# Patient Record
Sex: Female | Born: 1995 | Race: Black or African American | Hispanic: No | Marital: Single | Smoking: Never smoker
Health system: Southern US, Community
[De-identification: ages and names within clinical notes are randomized; demographics above are authoritative.]

## PROBLEM LIST (undated history)

## (undated) DIAGNOSIS — J45909 Unspecified asthma, uncomplicated: Secondary | ICD-10-CM

## (undated) DIAGNOSIS — K5190 Ulcerative colitis, unspecified, without complications: Secondary | ICD-10-CM

## (undated) HISTORY — PX: KNEE SURGERY: SHX244

---
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
## 2015-02-18 ENCOUNTER — Emergency Department (HOSPITAL_COMMUNITY)
Admission: EM | Admit: 2015-02-18 | Discharge: 2015-02-18 | Disposition: A | Discharge status: Home / Self Care | Payer: Medicaid Other | Attending: Emergency Medicine | Admitting: Emergency Medicine

## 2015-02-18 ENCOUNTER — Encounter (HOSPITAL_COMMUNITY): Payer: Self-pay | Admitting: Emergency Medicine

## 2015-02-18 ENCOUNTER — Emergency Department (HOSPITAL_COMMUNITY): Payer: Medicaid Other

## 2015-02-18 DIAGNOSIS — R519 Headache, unspecified: Secondary | ICD-10-CM

## 2015-02-18 DIAGNOSIS — Z791 Long term (current) use of non-steroidal anti-inflammatories (NSAID): Secondary | ICD-10-CM | POA: Insufficient documentation

## 2015-02-18 DIAGNOSIS — Z79899 Other long term (current) drug therapy: Secondary | ICD-10-CM | POA: Insufficient documentation

## 2015-02-18 DIAGNOSIS — J45901 Unspecified asthma with (acute) exacerbation: Secondary | ICD-10-CM | POA: Insufficient documentation

## 2015-02-18 DIAGNOSIS — Z8719 Personal history of other diseases of the digestive system: Secondary | ICD-10-CM | POA: Insufficient documentation

## 2015-02-18 DIAGNOSIS — R000 Tachycardia, unspecified: Secondary | ICD-10-CM | POA: Insufficient documentation

## 2015-02-18 DIAGNOSIS — R51 Headache: Secondary | ICD-10-CM

## 2015-02-18 HISTORY — DX: Unspecified asthma, uncomplicated: J45.909

## 2015-02-18 LAB — CBC WITH DIFFERENTIAL/PLATELET
BASOS PCT: 0 % (ref 0–1)
Basophils Absolute: 0 10*3/uL (ref 0.0–0.1)
Eosinophils Absolute: 1.5 10*3/uL — ABNORMAL HIGH (ref 0.0–0.7)
Eosinophils Relative: 14 % — ABNORMAL HIGH (ref 0–5)
HCT: 29.6 % — ABNORMAL LOW (ref 36.0–46.0)
HEMOGLOBIN: 9.5 g/dL — AB (ref 12.0–15.0)
LYMPHS ABS: 1.9 10*3/uL (ref 0.7–4.0)
Lymphocytes Relative: 18 % (ref 12–46)
MCH: 25.6 pg — AB (ref 26.0–34.0)
MCHC: 32.1 g/dL (ref 30.0–36.0)
MCV: 79.8 fL (ref 78.0–100.0)
Monocytes Absolute: 1.2 10*3/uL — ABNORMAL HIGH (ref 0.1–1.0)
Monocytes Relative: 11 % (ref 3–12)
Neutro Abs: 6.2 10*3/uL (ref 1.7–7.7)
Neutrophils Relative %: 57 % (ref 43–77)
Platelets: 462 10*3/uL — ABNORMAL HIGH (ref 150–400)
RBC: 3.71 MIL/uL — AB (ref 3.87–5.11)
RDW: 14.4 % (ref 11.5–15.5)
WBC: 10.9 10*3/uL — ABNORMAL HIGH (ref 4.0–10.5)

## 2015-02-18 LAB — BASIC METABOLIC PANEL
Anion gap: 10 (ref 5–15)
BUN: 6 mg/dL (ref 6–20)
CO2: 25 mmol/L (ref 22–32)
Calcium: 8.5 mg/dL — ABNORMAL LOW (ref 8.9–10.3)
Chloride: 105 mmol/L (ref 101–111)
Creatinine, Ser: 0.78 mg/dL (ref 0.44–1.00)
GFR calc Af Amer: >60 mL/min (ref >60)
GFR calc non Af Amer: >60 mL/min (ref >60)
Glucose, Bld: 91 mg/dL (ref 65–99)
Potassium: 3 mmol/L — ABNORMAL LOW (ref 3.5–5.1)
Sodium: 140 mmol/L (ref 135–145)

## 2015-02-18 MED ORDER — SODIUM CHLORIDE 0.9 % IV BOLUS (SEPSIS)
1000.0000 mL | Freq: Once | INTRAVENOUS | Status: AC
  Administered 2015-02-18: 1000 mL via INTRAVENOUS

## 2015-02-18 MED ORDER — PREDNISONE 10 MG PO TABS: 40.0000 mg | ORAL_TABLET | Freq: Every day | ORAL | Status: DC

## 2015-02-18 MED ORDER — METOCLOPRAMIDE HCL 5 MG/ML IJ SOLN
10.0000 mg | Freq: Once | INTRAMUSCULAR | Status: AC
  Administered 2015-02-18: 10 mg via INTRAVENOUS
  Filled 2015-02-18: qty 2

## 2015-02-18 MED ORDER — ALBUTEROL SULFATE HFA 108 (90 BASE) MCG/ACT IN AERS
1.0000 | INHALATION_SPRAY | Freq: Once | RESPIRATORY_TRACT | Status: AC
  Administered 2015-02-18: 2 via RESPIRATORY_TRACT
  Filled 2015-02-18: qty 6.7

## 2015-02-18 MED ORDER — IPRATROPIUM-ALBUTEROL 0.5-2.5 (3) MG/3ML IN SOLN
3.0000 mL | Freq: Once | RESPIRATORY_TRACT | Status: AC
  Administered 2015-02-18: 3 mL via RESPIRATORY_TRACT
  Filled 2015-02-18: qty 3

## 2015-02-18 MED ORDER — METHYLPREDNISOLONE SODIUM SUCC 125 MG IJ SOLR
125.0000 mg | Freq: Once | INTRAMUSCULAR | Status: AC
  Administered 2015-02-18: 125 mg via INTRAVENOUS
  Filled 2015-02-18: qty 2

## 2015-02-18 MED ORDER — DIPHENHYDRAMINE HCL 50 MG/ML IJ SOLN
12.5000 mg | Freq: Once | INTRAMUSCULAR | Status: AC
  Administered 2015-02-18: 12.5 mg via INTRAVENOUS
  Filled 2015-02-18: qty 1

## 2015-02-18 NOTE — ED Notes (Signed)
Patient transported to X-ray 

## 2015-02-18 NOTE — Discharge Instructions (Signed)

## 2015-02-18 NOTE — ED Notes (Signed)
Info pt urine sample have been order. Pt state she does not have to use restroom at this time

## 2015-02-18 NOTE — ED Notes (Signed)
Pt c/o SOB x 2 days, migraines, and body aches/weakness. Denies n/v pt has UC so she  has diarrhea.

## 2015-02-18 NOTE — ED Notes (Signed)
Patient reminded that a urine specimen was needed. 

## 2015-02-18 NOTE — ED Provider Notes (Signed)
CSN: [id number]     Arrival date & time 02/18/15  0945 History   First MD Initiated Contact with Patient 02/18/15 [phone]     Chief Complaint  Patient presents with  . Shortness of Breath  . Headache  . Generalized Body Aches      Patient is a 19 y.o. female presenting with shortness of breath and headaches. The history is provided by the patient. No language interpreter was used.  Shortness of Breath Associated symptoms: headaches   Headache  Tiffany Herman presents for evaluation of shortness of breath, body aches, headache. She states that 2 days ago she developed increasing shortness of breath and wheezing. She has associated nasal congestion and frontal headache described as a migraine headache. She has generalized body aches. She had a sore throat but this is since resolved. She denies any fevers but does not have a thermometer. She has a cough productive of mucus. She denies any abdominal pain, nausea, vomiting. She has ulcerative colitis with chronic diarrhea, this is unchanged from baseline. She also has a history of asthma and takes albuterol inhaler as needed. She has transient improvement with albuterol but then her wheezing returns. She takes Remicade and mesalamine for ulcerative colitis. She is not on chronic steroids. She takes no permanent medications and she is a nonsmoker. Symptoms are moderate, constant, worsening.  Past Medical History  Diagnosis Date  . Ulcerative colitis   . Asthma    Past Surgical History  Procedure Laterality Date  . Knee surgery     History reviewed. No pertinent family history. History  Substance Use Topics  . Smoking status: Never Smoker   . Smokeless tobacco: Never Used  . Alcohol Use: No   OB History    No data available     Review of Systems  Respiratory: Positive for shortness of breath.   Neurological: Positive for headaches.  All other systems reviewed and are negative.     Allergies  Flexeril  Home Medications   Prior to  Admission medications   Medication Sig Start Date End Date Taking? Authorizing Provider  dicyclomine (BENTYL) 20 MG tablet Take 1 tablet (20 mg total) by mouth 2 (two) times daily. 09/28/14   Garlon Hatchet, PA-C  HYDROcodone-acetaminophen (NORCO/VICODIN) 5-325 MG per tablet Take 1 tablet by mouth every 6 (six) hours as needed for moderate pain or severe pain. Patient not taking: Reported on 09/28/2014 05/18/14   Marissa Sciacca, PA-C  mesalamine (LIALDA) 1.2 G EC tablet Take 2.4 g by mouth 2 (two) times daily.     Historical Provider, MD  ondansetron (ZOFRAN ODT) 4 MG disintegrating tablet Take 1 tablet (4 mg total) by mouth every 8 (eight) hours as needed for nausea. 09/28/14   Garlon Hatchet, PA-C  predniSONE (DELTASONE) 10 MG tablet Take 2 tablets (20 mg total) by mouth 2 (two) times daily with a meal. Patient not taking: Reported on 09/28/2014 04/29/14   Samuel Jester, DO  predniSONE (DELTASONE) 20 MG tablet 3 tabs po day one, then 2 tabs daily x 4 days 05/18/14   Marissa Sciacca, PA-C   BP 127/87 mmHg  Pulse 100  Temp(Src) 98.9 F (37.2 C) (Oral)  Resp 22  SpO2 93%  LMP 02/14/2015 (Exact Date) Physical Exam  Constitutional: She is oriented to person, place, and time. She appears well-developed and well-nourished.  HENT:  Head: Normocephalic and atraumatic.  Cardiovascular: Regular rhythm.   No murmur heard. Tachycardic  Pulmonary/Chest: Effort normal. No respiratory distress.  Slightly  decreased air movement bilaterally, and expiratory wheezes of her right lung fields.  Abdominal: Soft. There is no tenderness. There is no rebound and no guarding.  Musculoskeletal: She exhibits no edema or tenderness.  Neurological: She is alert and oriented to person, place, and time.  Skin: Skin is warm and dry.  Psychiatric: She has a normal mood and affect. Her behavior is normal.  Nursing note and vitals reviewed.   ED Course  Procedures (including critical care time) Labs Review Labs  Reviewed  CBC WITH DIFFERENTIAL/PLATELET - Abnormal; Notable for the following:    WBC 10.9 (*)    RBC 3.71 (*)    Hemoglobin 9.5 (*)    HCT 29.6 (*)    MCH 25.6 (*)    Platelets 462 (*)    Monocytes Absolute 1.2 (*)    Eosinophils Relative 14 (*)    Eosinophils Absolute 1.5 (*)    All other components within normal limits  BASIC METABOLIC PANEL - Abnormal; Notable for the following:    Potassium 3.0 (*)    Calcium 8.5 (*)    All other components within normal limits    Imaging Review Dg Chest 2 View  02/18/2015   CLINICAL DATA:  Shortness of breath for 2 days. Migraine headaches. Body aches and weakness.  EXAM: CHEST - 2 VIEW  COMPARISON:  None.  FINDINGS: The heart size and mediastinal contours are within normal limits. Both lungs are clear. The visualized skeletal structures are unremarkable.  IMPRESSION: Negative two view chest x-ray   Electronically Signed   By: Marin Roberts M.D.   On: 02/18/2015 11:00     EKG Interpretation None      MDM   Final diagnoses:  Asthma exacerbation  Acute nonintractable headache, unspecified headache type    Patient with history of ulcerative colitis and asthma here for shortness of breath, headaches. In terms of shortness of breath, patient was wheezing on initial evaluation, wheezing and shortness of breath have resolved after albuterol treatment. No evidence of pneumonia. History of presentation is not consistent with PE. In terms of headache, consistent with migraine headache, symptoms have resolved after headache cocktail. Discussed home care for likely URI with asthma exacerbation as well as PCPfollow-up and return precautions.  Tilden Fossa, MD 02/18/15 [phone]

## 2018-06-16 ENCOUNTER — Emergency Department
Admission: EM | Admit: 2018-06-16 | Discharge: 2018-06-16 | Disposition: A | Discharge status: Home / Self Care | Payer: Medicaid Other | Attending: Emergency Medicine | Admitting: Emergency Medicine

## 2018-06-16 ENCOUNTER — Encounter: Payer: Self-pay | Admitting: Emergency Medicine

## 2018-06-16 ENCOUNTER — Other Ambulatory Visit: Payer: Self-pay

## 2018-06-16 ENCOUNTER — Emergency Department: Payer: Medicaid Other

## 2018-06-16 DIAGNOSIS — J45901 Unspecified asthma with (acute) exacerbation: Secondary | ICD-10-CM | POA: Diagnosis not present

## 2018-06-16 DIAGNOSIS — R0602 Shortness of breath: Secondary | ICD-10-CM | POA: Diagnosis present

## 2018-06-16 LAB — BASIC METABOLIC PANEL
Anion gap: 8 (ref 5–15)
BUN: 12 mg/dL (ref 6–20)
CO2: 24 mmol/L (ref 22–32)
CREATININE: 0.75 mg/dL (ref 0.44–1.00)
Calcium: 8.5 mg/dL — ABNORMAL LOW (ref 8.9–10.3)
Chloride: 105 mmol/L (ref 98–111)
GFR calc Af Amer: >60 mL/min (ref >60)
GLUCOSE: 93 mg/dL (ref 70–99)
Potassium: 3.3 mmol/L — ABNORMAL LOW (ref 3.5–5.1)
Sodium: 137 mmol/L (ref 135–145)

## 2018-06-16 LAB — CBC WITH DIFFERENTIAL/PLATELET
Basophils Absolute: 0.1 10*3/uL (ref 0–0.1)
Basophils Relative: 1 %
EOS ABS: 1.1 10*3/uL — AB (ref 0–0.7)
Eosinophils Relative: 8 %
HCT: 30 % — ABNORMAL LOW (ref 35.0–47.0)
Hemoglobin: 9.2 g/dL — ABNORMAL LOW (ref 12.0–16.0)
Lymphocytes Relative: 22 %
Lymphs Abs: 3.2 10*3/uL (ref 1.0–3.6)
MCH: 21.2 pg — ABNORMAL LOW (ref 26.0–34.0)
MCHC: 30.8 g/dL — AB (ref 32.0–36.0)
MCV: 68.6 fL — ABNORMAL LOW (ref 80.0–100.0)
MONO ABS: 1.2 10*3/uL — AB (ref 0.2–0.9)
MONOS PCT: 8 %
Neutro Abs: 8.7 10*3/uL — ABNORMAL HIGH (ref 1.4–6.5)
Neutrophils Relative %: 61 %
Platelets: 560 10*3/uL — ABNORMAL HIGH (ref 150–440)
RBC: 4.36 MIL/uL (ref 3.80–5.20)
RDW: 20.4 % — ABNORMAL HIGH (ref 11.5–14.5)
WBC: 14.2 10*3/uL — ABNORMAL HIGH (ref 3.6–11.0)

## 2018-06-16 MED ORDER — PREDNISONE 20 MG PO TABS
60.0000 mg | ORAL_TABLET | Freq: Once | ORAL | Status: AC
  Administered 2018-06-16: 60 mg via ORAL
  Filled 2018-06-16: qty 3

## 2018-06-16 MED ORDER — ALBUTEROL SULFATE HFA 108 (90 BASE) MCG/ACT IN AERS: 2.0000 | INHALATION_SPRAY | Freq: Four times a day (QID) | RESPIRATORY_TRACT | 0 refills | Status: AC | PRN

## 2018-06-16 MED ORDER — PREDNISONE 20 MG PO TABS: 40.0000 mg | ORAL_TABLET | Freq: Every day | ORAL | 0 refills | Status: AC

## 2018-06-16 MED ORDER — IPRATROPIUM-ALBUTEROL 0.5-2.5 (3) MG/3ML IN SOLN
9.0000 mL | Freq: Once | RESPIRATORY_TRACT | Status: AC
  Administered 2018-06-16: 9 mL via RESPIRATORY_TRACT
  Filled 2018-06-16: qty 9

## 2018-06-16 NOTE — ED Triage Notes (Signed)
EMS pt to rm 26 from home with report of cough and shortness of breath that started last night around 7 pm. Pt reports hx of asthma but has not used inhaler in a long time.

## 2018-06-16 NOTE — ED Provider Notes (Signed)
[facility] Emergency Department Provider Note  ___________________________________________   First MD Initiated Contact with Patient 06/16/18 0710     (approximate)  I have reviewed the triage vital signs and the nursing notes.   HISTORY  Chief Complaint Shortness of Breath   HPI Tiffany Herman is a 22 y.o. female with a history of asthma and ulcerative colitis not on any medications at this time has been having shortness of breath over the past 24 hours.  She says that she is not on any ulcerative colitis treatment, does not have any inhalers at home.  Says that she is unable to afford these medications.  Says that she has had tightness in her chest as well as shortness of breath over the past 24 hours that started with the change in weather.  Denies any fever, productive cough or runny nose.  No known sick contacts.  Past Medical History:  Diagnosis Date  . Asthma   . Ulcerative colitis (HCC)     There are no active problems to display for this patient.   Past Surgical History:  Procedure Laterality Date  . KNEE SURGERY      Prior to Admission medications   Medication Sig Start Date End Date Taking? Authorizing Provider  albuterol (PROVENTIL HFA;VENTOLIN HFA) 108 (90 BASE) MCG/ACT inhaler Inhale 2 puffs into the lungs every 4 (four) hours as needed for wheezing or shortness of breath.    [provider]  dicyclomine (BENTYL) 20 MG tablet Take 1 tablet (20 mg total) by mouth 2 (two) times daily. Patient not taking: Reported on 02/18/2015 09/28/14   Garlon Hatchet, PA-C  diphenhydrAMINE (BENADRYL) 25 MG tablet Take 100 mg by mouth every 6 (six) hours as needed for allergies.    [provider]  ondansetron (ZOFRAN ODT) 4 MG disintegrating tablet Take 1 tablet (4 mg total) by mouth every 8 (eight) hours as needed for nausea. Patient not taking: Reported on 02/18/2015 09/28/14   Garlon Hatchet, PA-C  predniSONE (DELTASONE) 10 MG tablet  Take 4 tablets (40 mg total) by mouth daily. 02/18/15   Tilden Fossa, MD    Allergies Flexeril [cyclobenzaprine]  History reviewed. No pertinent family history.  Social History Social History   Tobacco Use  . Smoking status: Never Smoker  . Smokeless tobacco: Never Used  Substance Use Topics  . Alcohol use: No  . Drug use: No    Review of Systems  Constitutional: No fever/chills Eyes: No visual changes. ENT: No sore throat. Cardiovascular: As above Respiratory: As above Gastrointestinal: No abdominal pain.  No nausea, no vomiting.  No diarrhea.  No constipation. Genitourinary: Negative for dysuria. Musculoskeletal: Negative for back pain. Skin: Negative for rash. Neurological: Negative for headaches, focal weakness or numbness.   ____________________________________________   PHYSICAL EXAM:  VITAL SIGNS: ED Triage Vitals  Enc Vitals Group     BP 06/16/18 0557 (!) 134/95     Pulse Rate 06/16/18 0557 85     Resp 06/16/18 0557 (!) 22     Temp 06/16/18 0557 97.9 F (36.6 C)     Temp Source 06/16/18 0557 Oral     SpO2 06/16/18 0557 98 %     Weight 06/16/18 0600 185 lb (83.9 kg)     Height 06/16/18 0600 5\' 10"  (1.778 m)     Head Circumference --      Peak Flow --      Pain Score 06/16/18 0600 3     Pain Loc --  Pain Edu? --      Excl. in GC? --     Constitutional: Alert and oriented. Well appearing and in no acute distress. Eyes: Conjunctivae are normal.  Head: Atraumatic. Nose: No congestion/rhinnorhea. Mouth/Throat: Mucous membranes are moist.  Neck: No stridor.   Cardiovascular: Normal rate, regular rhythm. Grossly normal heart sounds.  Respiratory: Normal respiratory effort.  No retractions.  Minimal end expiratory wheezes.  No respiratory distress.  Speaking in full sentences. Gastrointestinal: Soft and nontender. No distention.  Musculoskeletal: No lower extremity tenderness nor edema.  No joint effusions. Neurologic:  Normal speech and  language. No gross focal neurologic deficits are appreciated. Skin:  Skin is warm, dry and intact. No rash noted. Psychiatric: Mood and affect are normal. Speech and behavior are normal.  ____________________________________________   LABS (all labs ordered are listed, but only abnormal results are displayed)  Labs Reviewed  CBC WITH DIFFERENTIAL/PLATELET - Abnormal; Notable for the following components:      Result Value   WBC 14.2 (*)    Hemoglobin 9.2 (*)    HCT 30.0 (*)    MCV 68.6 (*)    MCH 21.2 (*)    MCHC 30.8 (*)    RDW 20.4 (*)    Platelets 560 (*)    Neutro Abs 8.7 (*)    Monocytes Absolute 1.2 (*)    Eosinophils Absolute 1.1 (*)    All other components within normal limits  BASIC METABOLIC PANEL - Abnormal; Notable for the following components:   Potassium 3.3 (*)    Calcium 8.5 (*)    All other components within normal limits   ____________________________________________  EKG  ED ECG REPORT I, Arelia Longest, the attending physician, personally viewed and interpreted this ECG.   Date: 06/16/2018  EKG Time: 0647  Rate: 83  Rhythm: normal sinus rhythm  Axis: Normal  Intervals:none  ST&T Change: No ST segment elevation or depression.  No abnormal T wave inversion.  ____________________________________________  RADIOLOGY  No acute cardia pulmonary disease on the chest x-ray ____________________________________________   PROCEDURES  Procedure(s) performed:   Procedures  Critical Care performed:   ____________________________________________   INITIAL IMPRESSION / ASSESSMENT AND PLAN / ED COURSE  Pertinent labs & imaging results that were available during my care of the patient were reviewed by me and considered in my medical decision making (see chart for details).  Differential includes, but is not limited to, viral syndrome, bronchitis including COPD exacerbation, pneumonia, reactive airway disease including asthma, CHF including  exacerbation with or without pulmonary/interstitial edema, pneumothorax, ACS, thoracic trauma, and pulmonary embolism. As part of my medical decision making, I reviewed the following data within the electronic MEDICAL RECORD NUMBER Notes from prior ED visits  ----------------------------------------- 7:41 AM on 06/16/2018 -----------------------------------------  Patient at this time with reassuring work-up.  Will require reevaluation once received nebs and steroids.  Signed out to Dr. Fanny Bien. ____________________________________________   FINAL CLINICAL IMPRESSION(S) / ED DIAGNOSES  Asthma exacerbation.  NEW MEDICATIONS STARTED DURING THIS VISIT:  New Prescriptions   No medications on file     Note:  This document was prepared using Dragon voice recognition software and may include unintentional dictation errors.     Myrna Blazer, MD 06/16/18 [phone]

## 2018-06-16 NOTE — ED Provider Notes (Signed)
Vitals:   06/16/18 0718 06/16/18 0730  BP: 128/87 133/87  Pulse: 88 89  Resp: 20 18  Temp:    SpO2: 100% 98%     Patient is resting comfortably, reports her shortness of breath is resolved.  No complaints at present.  She is very well-appearing, lungs are clear speaking in full and clear sentences with normal respirations.  Appears appropriate and safe for discharge at this time.  Return precautions and treatment recommendations and follow-up discussed with the patient who is agreeable with the plan.    Sharyn Creamer, MD 06/16/18 [phone]

## 2019-03-28 IMAGING — CR DG CHEST 2V
2 series · 2 of 2 positions shown · non-contrast
Comparison: 02/18/2015

CLINICAL DATA: Cough, shortness of breath, chest tightness

EXAM:
CHEST - 2 VIEW

[chest pa]
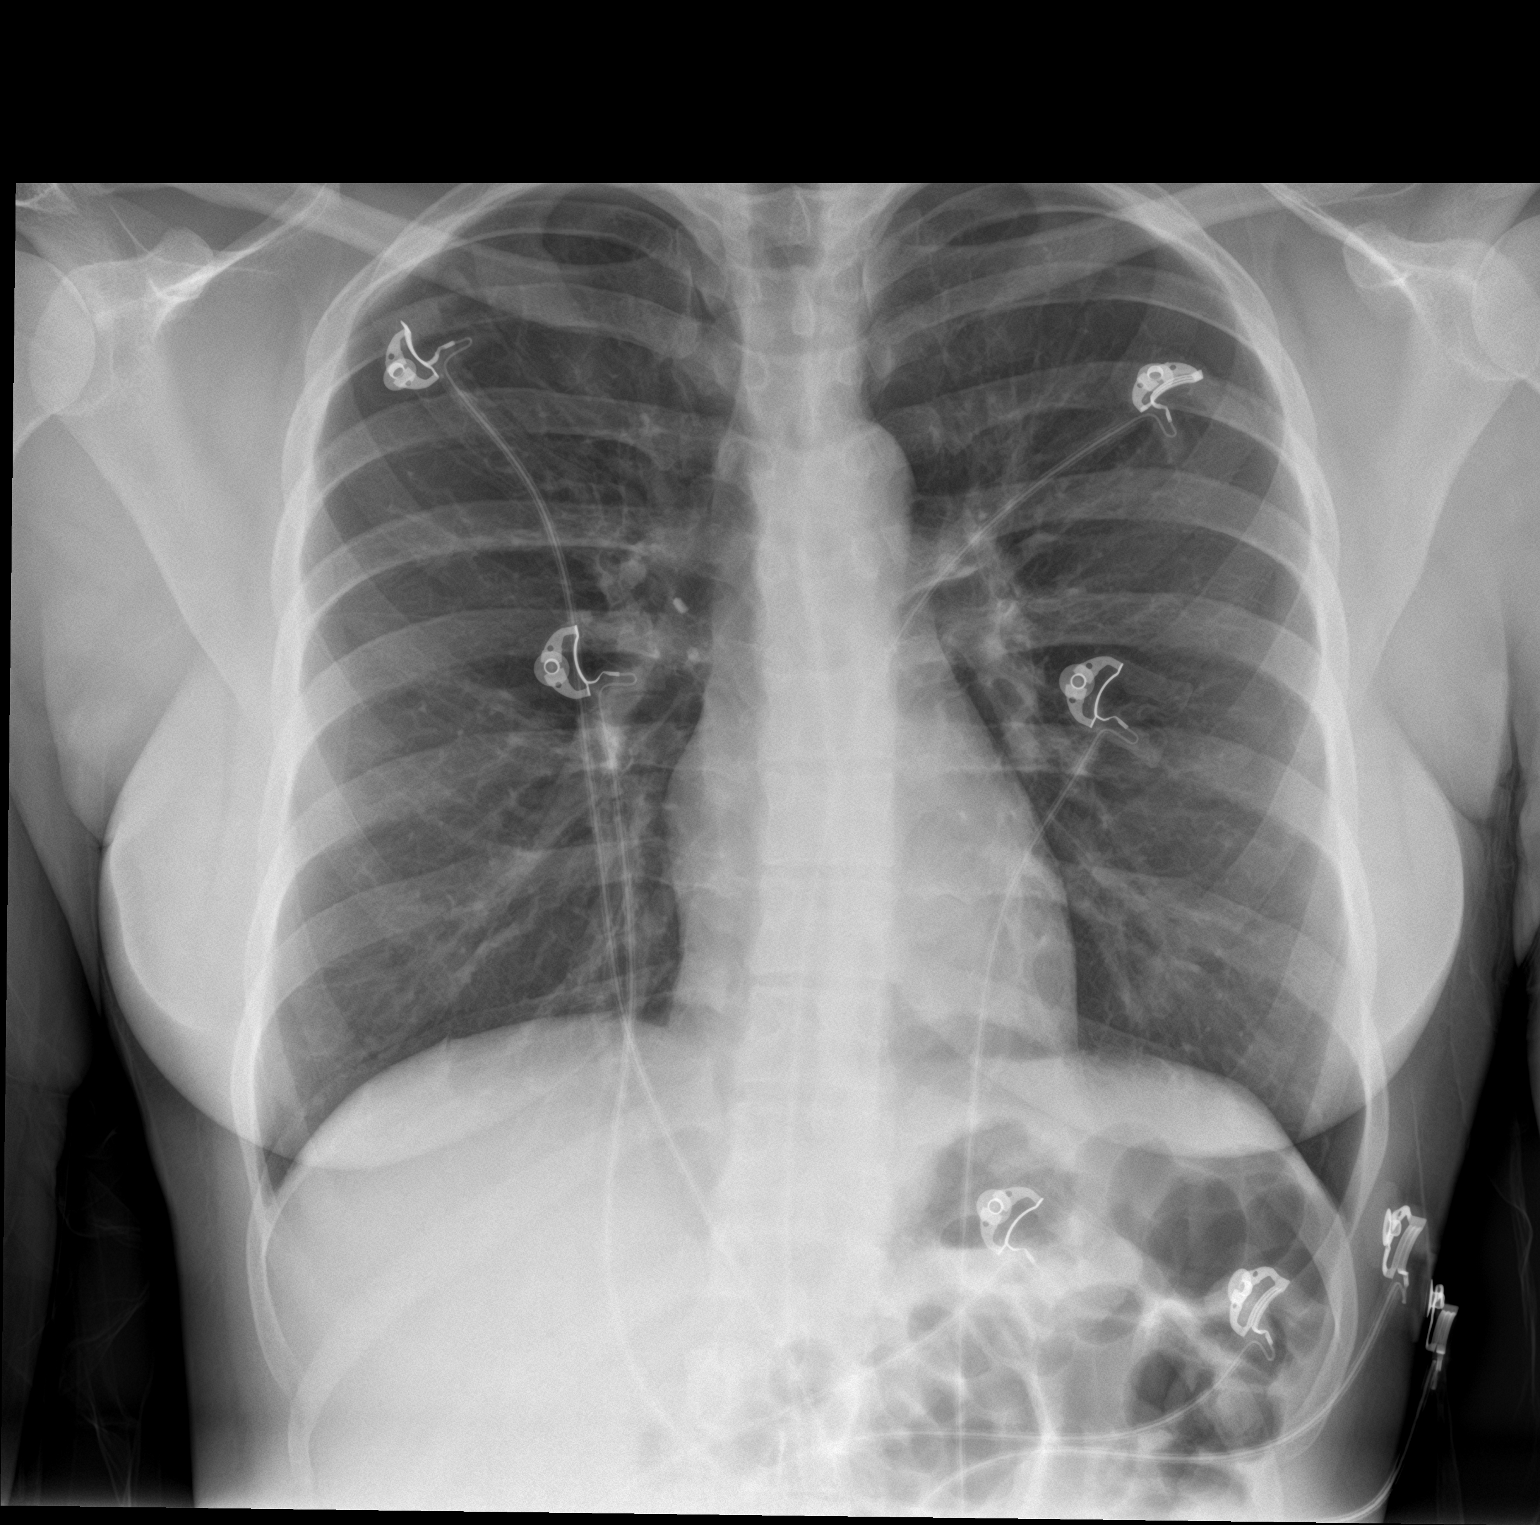

[chest lat]
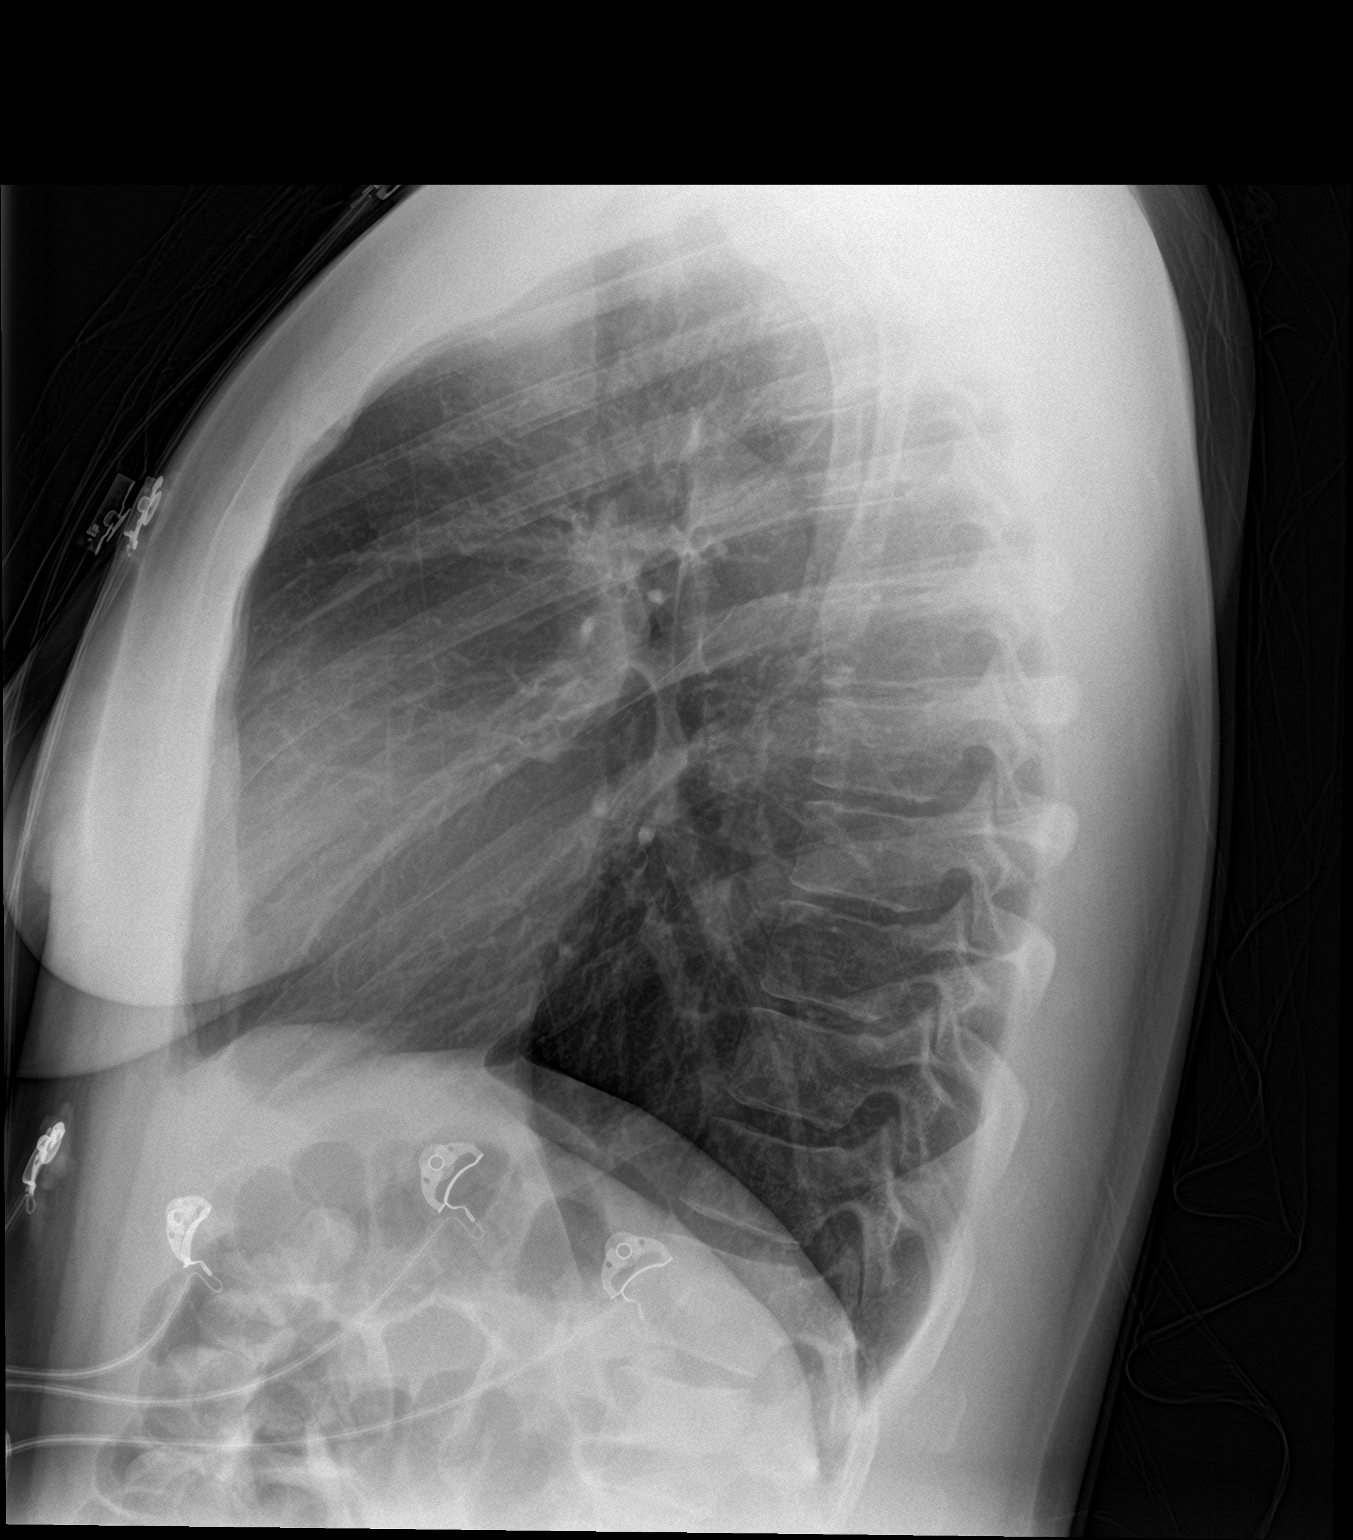

[2 of 2 positions shown; findings below may reference images not displayed]

FINDINGS: Heart and mediastinal contours are within normal limits. No focal
opacities or effusions. No acute bony abnormality.
IMPRESSION: No active cardiopulmonary disease.
